# Patient Record
Sex: Male | Born: 1980 | Hispanic: No | Marital: Single | Smoking: Current every day smoker
Health system: Southern US, Community
[De-identification: ages and names within clinical notes are randomized; demographics above are authoritative.]

## PROBLEM LIST (undated history)

## (undated) DIAGNOSIS — M899 Disorder of bone, unspecified: Secondary | ICD-10-CM

## (undated) HISTORY — PX: APPENDECTOMY: SHX54

---

## 2011-05-30 ENCOUNTER — Encounter: Payer: Self-pay | Admitting: Internal Medicine

## 2011-05-30 DIAGNOSIS — Z0000 Encounter for general adult medical examination without abnormal findings: Secondary | ICD-10-CM | POA: Insufficient documentation

## 2011-06-03 ENCOUNTER — Ambulatory Visit: Payer: Self-pay | Admitting: Internal Medicine

## 2011-07-20 ENCOUNTER — Emergency Department (INDEPENDENT_AMBULATORY_CARE_PROVIDER_SITE_OTHER): Payer: Self-pay

## 2011-07-20 ENCOUNTER — Encounter (HOSPITAL_COMMUNITY): Payer: Self-pay | Admitting: Emergency Medicine

## 2011-07-20 ENCOUNTER — Emergency Department (INDEPENDENT_AMBULATORY_CARE_PROVIDER_SITE_OTHER)
Admission: EM | Admit: 2011-07-20 | Discharge: 2011-07-20 | Disposition: A | Discharge status: Home / Self Care | Payer: Self-pay | Source: Home / Self Care | Attending: Emergency Medicine | Admitting: Emergency Medicine

## 2011-07-20 DIAGNOSIS — S39012A Strain of muscle, fascia and tendon of lower back, initial encounter: Secondary | ICD-10-CM

## 2011-07-20 DIAGNOSIS — S339XXA Sprain of unspecified parts of lumbar spine and pelvis, initial encounter: Secondary | ICD-10-CM

## 2011-07-20 HISTORY — DX: Disorder of bone, unspecified: M89.9

## 2011-07-20 LAB — POCT URINALYSIS DIP (DEVICE)
Bilirubin Urine: NEGATIVE
Glucose, UA: NEGATIVE mg/dL
Hgb urine dipstick: NEGATIVE
Specific Gravity, Urine: 1.015 (ref 1.005–1.030)
Urobilinogen, UA: 0.2 mg/dL (ref 0.0–1.0)
pH: 7.5 (ref 5.0–8.0)

## 2011-07-20 MED ORDER — CYCLOBENZAPRINE HCL 10 MG PO TABS: 10.0000 mg | ORAL_TABLET | Freq: Three times a day (TID) | ORAL | Status: AC | PRN

## 2011-07-20 MED ORDER — HYDROCODONE-ACETAMINOPHEN 5-325 MG PO TABS: 2.0000 | ORAL_TABLET | ORAL | Status: AC | PRN

## 2011-07-20 MED ORDER — PREDNISONE 20 MG PO TABS: ORAL_TABLET | ORAL | Status: AC

## 2011-07-20 MED ORDER — MELOXICAM 15 MG PO TABS: 15.0000 mg | ORAL_TABLET | Freq: Every day | ORAL | Status: DC
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
## 2011-07-20 NOTE — ED Notes (Signed)
PT HERE WITH PROGRESSIVE LOWER BACK PAIN WITH RADIATING TO TAILBONE X 1 WEEK.PT STATES LAST WEEK HE HAD BILAT LEG INTERMITT NUMBNESS THEN SX WORSENED TO BACK AREA.HX BONE PROBLEMS FROM A CHILD BUT LAST INJECTION WAS AGE 31.DENIES INJURY OR STRAIN.SX CONSTANT ACHY THROB PAIN AND DIFF TO AMBULATE OR LIE DOWN

## 2011-07-20 NOTE — Discharge Instructions (Signed)
Take the medication as written. Take 1 gram of tylenol with the motrin up to 4 times a day as needed for pain and fever. This is an effective combination for pain. Take the hydrocodone/norco only for severe pain. Do not take the tylenol and hydrocodone/norco as they both have tylenol in them and too much can hurt your liver. Do the back exercises 5 repetitions each. Hold each exercise 5-10 seconds. Return if you get worse, have a  fever >100.4, or for any concerns.   Go to www.goodrx.com to look up your medications. This will give you a list of where you can find your prescriptions at the most affordable prices.

## 2011-11-11 ENCOUNTER — Emergency Department (INDEPENDENT_AMBULATORY_CARE_PROVIDER_SITE_OTHER): Payer: Self-pay

## 2011-11-11 ENCOUNTER — Encounter (HOSPITAL_COMMUNITY): Payer: Self-pay

## 2011-11-11 ENCOUNTER — Emergency Department (INDEPENDENT_AMBULATORY_CARE_PROVIDER_SITE_OTHER)
Admission: EM | Admit: 2011-11-11 | Discharge: 2011-11-11 | Disposition: A | Discharge status: Home / Self Care | Payer: Self-pay | Source: Home / Self Care | Attending: Emergency Medicine | Admitting: Emergency Medicine

## 2011-11-11 DIAGNOSIS — M546 Pain in thoracic spine: Secondary | ICD-10-CM

## 2011-11-11 MED ORDER — TRAMADOL HCL 50 MG PO TABS: 50.0000 mg | ORAL_TABLET | Freq: Four times a day (QID) | ORAL | Status: AC | PRN

## 2011-11-11 NOTE — ED Notes (Signed)
C/o pain to mid and upper back since yesterday- states worse on rt  side.  Denies heavy lifting or strenuous activity.  States he had similar 3 months ago.
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
## 2012-05-27 ENCOUNTER — Encounter (HOSPITAL_COMMUNITY): Payer: Self-pay | Admitting: Emergency Medicine

## 2012-05-27 ENCOUNTER — Emergency Department (HOSPITAL_COMMUNITY)
Admission: EM | Admit: 2012-05-27 | Discharge: 2012-05-27 | Disposition: A | Discharge status: Home / Self Care | Payer: No Typology Code available for payment source | Attending: Emergency Medicine | Admitting: Emergency Medicine

## 2012-05-27 ENCOUNTER — Emergency Department (HOSPITAL_COMMUNITY): Payer: No Typology Code available for payment source

## 2012-05-27 DIAGNOSIS — S298XXA Other specified injuries of thorax, initial encounter: Secondary | ICD-10-CM | POA: Insufficient documentation

## 2012-05-27 DIAGNOSIS — S161XXA Strain of muscle, fascia and tendon at neck level, initial encounter: Secondary | ICD-10-CM

## 2012-05-27 DIAGNOSIS — Y92410 Unspecified street and highway as the place of occurrence of the external cause: Secondary | ICD-10-CM | POA: Insufficient documentation

## 2012-05-27 DIAGNOSIS — S39012A Strain of muscle, fascia and tendon of lower back, initial encounter: Secondary | ICD-10-CM

## 2012-05-27 DIAGNOSIS — S139XXA Sprain of joints and ligaments of unspecified parts of neck, initial encounter: Secondary | ICD-10-CM | POA: Insufficient documentation

## 2012-05-27 DIAGNOSIS — Y9389 Activity, other specified: Secondary | ICD-10-CM | POA: Insufficient documentation

## 2012-05-27 DIAGNOSIS — Z8739 Personal history of other diseases of the musculoskeletal system and connective tissue: Secondary | ICD-10-CM | POA: Insufficient documentation

## 2012-05-27 DIAGNOSIS — IMO0002 Reserved for concepts with insufficient information to code with codable children: Secondary | ICD-10-CM | POA: Insufficient documentation

## 2012-05-27 DIAGNOSIS — F17200 Nicotine dependence, unspecified, uncomplicated: Secondary | ICD-10-CM | POA: Insufficient documentation

## 2012-05-27 MED ORDER — NAPROXEN 500 MG PO TABS: 500.0000 mg | ORAL_TABLET | Freq: Two times a day (BID) | ORAL | Status: DC

## 2012-05-27 MED ORDER — HYDROCODONE-ACETAMINOPHEN 5-325 MG PO TABS: 2.0000 | ORAL_TABLET | ORAL | Status: DC | PRN

## 2012-05-27 MED ORDER — METHOCARBAMOL 500 MG PO TABS: 500.0000 mg | ORAL_TABLET | Freq: Two times a day (BID) | ORAL | Status: DC
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
## 2012-06-12 NOTE — ED Provider Notes (Signed)
Medical screening examination/treatment/procedure(s) were performed by non-physician practitioner and as supervising physician I was immediately available for consultation/collaboration.  Addison Whidbee, MD 06/12/12 0710 
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
## 2018-12-07 ENCOUNTER — Other Ambulatory Visit: Payer: Self-pay

## 2018-12-07 ENCOUNTER — Ambulatory Visit (HOSPITAL_COMMUNITY)
Admission: EM | Admit: 2018-12-07 | Discharge: 2018-12-07 | Disposition: A | Discharge status: Home / Self Care | Payer: Self-pay | Attending: Family Medicine | Admitting: Family Medicine

## 2018-12-07 ENCOUNTER — Encounter (HOSPITAL_COMMUNITY): Payer: Self-pay

## 2018-12-07 DIAGNOSIS — M79605 Pain in left leg: Secondary | ICD-10-CM

## 2018-12-07 DIAGNOSIS — L309 Dermatitis, unspecified: Secondary | ICD-10-CM

## 2018-12-07 MED ORDER — TRIAMCINOLONE ACETONIDE 0.5 % EX OINT: 1.0000 "application " | TOPICAL_OINTMENT | Freq: Two times a day (BID) | CUTANEOUS | 0 refills | Status: DC

## 2018-12-07 MED ORDER — PREDNISONE 20 MG PO TABS: 40.0000 mg | ORAL_TABLET | Freq: Every day | ORAL | 0 refills | Status: AC

## 2018-12-07 NOTE — Discharge Instructions (Signed)
Continue with aquaphor, liberally apply especially after bathing and before bed.  Steroid ointment twice a day, very light amount to neck area and use for only 2 weeks at a time.  I have sent for 5 days of oral steroids as well which I hope will help with your leg as well as your skin.  If any worsening of your leg symptoms- redness, swelling, fevers, weakness, increased pain, unable to walk- or otherwise worsening please return or go to the ER.  Please establish with a primary care provider as needed for management as needed.

## 2018-12-07 NOTE — ED Triage Notes (Signed)
Patient presents to Urgent Care with complaints of itchy rash since cutting the grass a few days ago. Patient reports he thinks he has eczema, states his mother and daughter both have it.
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
## 2021-02-07 ENCOUNTER — Ambulatory Visit (HOSPITAL_COMMUNITY)
Admission: EM | Admit: 2021-02-07 | Discharge: 2021-02-07 | Disposition: A | Discharge status: Home / Self Care | Payer: Self-pay | Attending: Internal Medicine | Admitting: Internal Medicine

## 2021-02-07 ENCOUNTER — Encounter (HOSPITAL_COMMUNITY): Payer: Self-pay

## 2021-02-07 ENCOUNTER — Ambulatory Visit (HOSPITAL_COMMUNITY): Payer: Self-pay

## 2021-02-07 ENCOUNTER — Other Ambulatory Visit: Payer: Self-pay

## 2021-02-07 ENCOUNTER — Ambulatory Visit (INDEPENDENT_AMBULATORY_CARE_PROVIDER_SITE_OTHER): Payer: Self-pay

## 2021-02-07 DIAGNOSIS — M79642 Pain in left hand: Secondary | ICD-10-CM

## 2021-02-07 DIAGNOSIS — S6722XA Crushing injury of left hand, initial encounter: Secondary | ICD-10-CM

## 2021-02-07 MED ORDER — TRAMADOL HCL 50 MG PO TABS: 50.0000 mg | ORAL_TABLET | Freq: Four times a day (QID) | ORAL | 0 refills | Status: DC | PRN

## 2021-02-07 NOTE — Discharge Instructions (Addendum)
-  Tramadol for pain, up to every 6 hours. This medication can cause drowsiness, so don't take before driving. Don't drink alcohol while on this medication. -You can take ibuprofen and tylenol for additional relief:  -You can take Tylenol up to 1000 mg 3 times daily, and ibuprofen up to 600 mg 3 times daily with food.  You can take these together, or alternate every 3-4 hours. -Ice, rest -Use the wrist brace while pain persists -If symptoms persist in 3 days, follow-up with an orthopedist. I recommend [facility] at [address], [place], [place] [address]. You can schedule an appointment by calling [phone]) or online ([email or web address]), but they also have a walk-in clinic M-F 8a-8p and Sat 10a-3p.
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
## 2021-02-07 NOTE — ED Triage Notes (Signed)
Pt presents with a L hand injury today around 0730. Pt states the hand got caught in between a [place] and trailer.

## 2022-05-22 IMAGING — DX DG HAND COMPLETE 3+V*L*
3 series · 3 of 3 positions shown · non-contrast
Comparison: None.

CLINICAL DATA: Blunt trauma to the hand with pain, initial
encounter

EXAM:
LEFT HAND - COMPLETE 3+ VIEW

[hand pa]
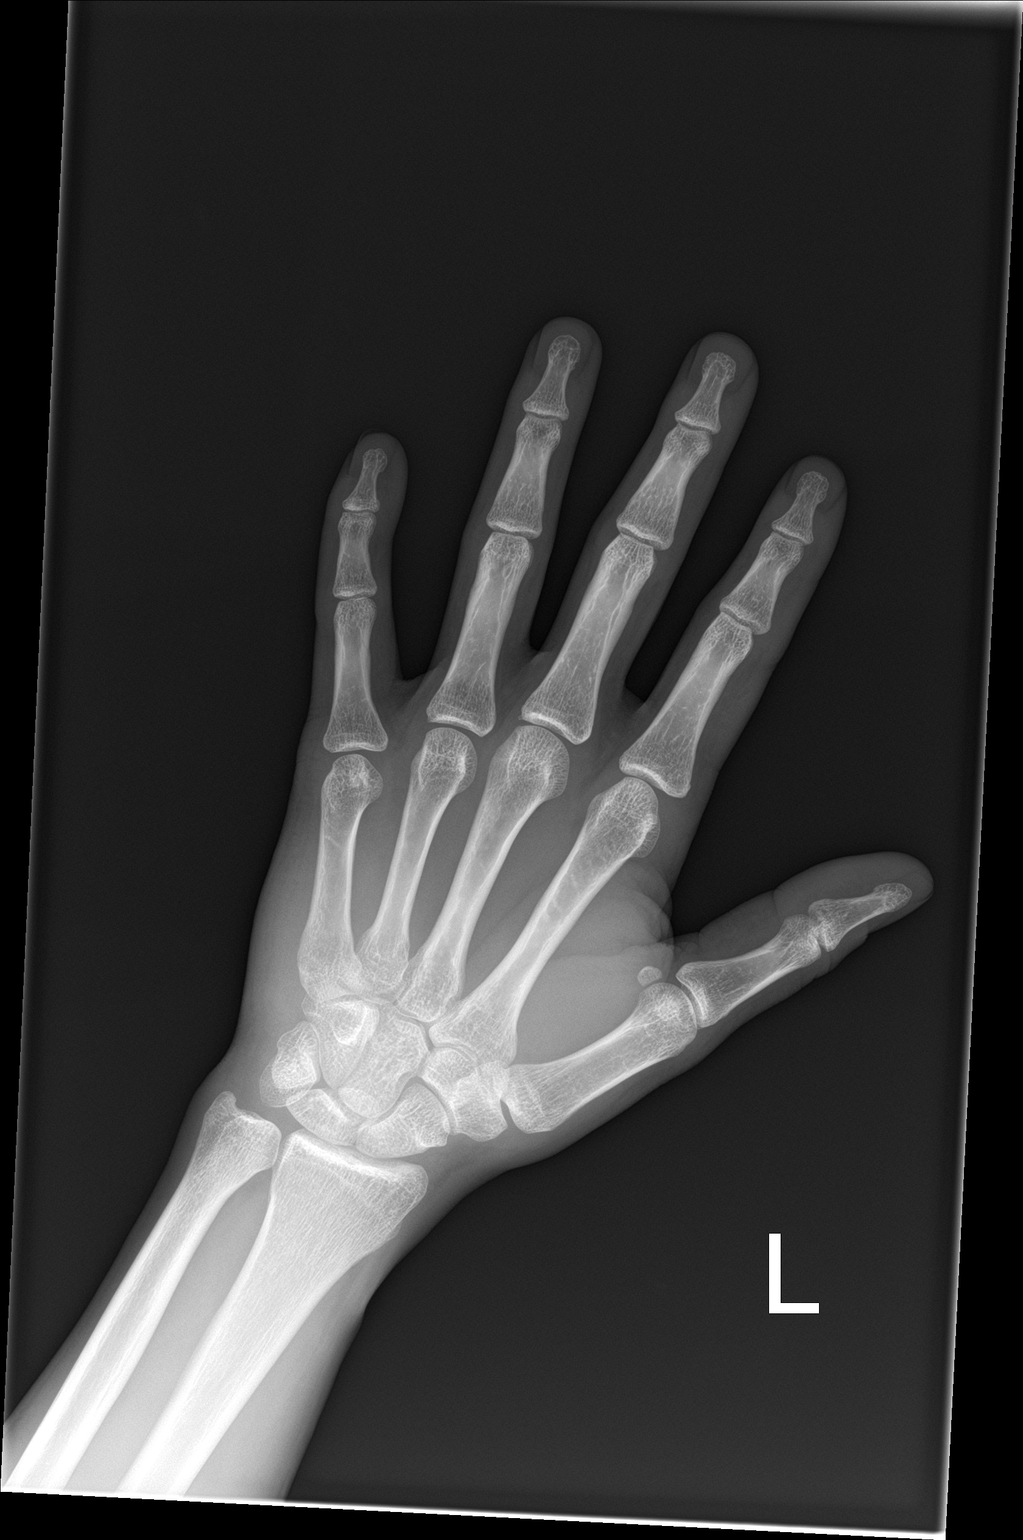

[hand obl]
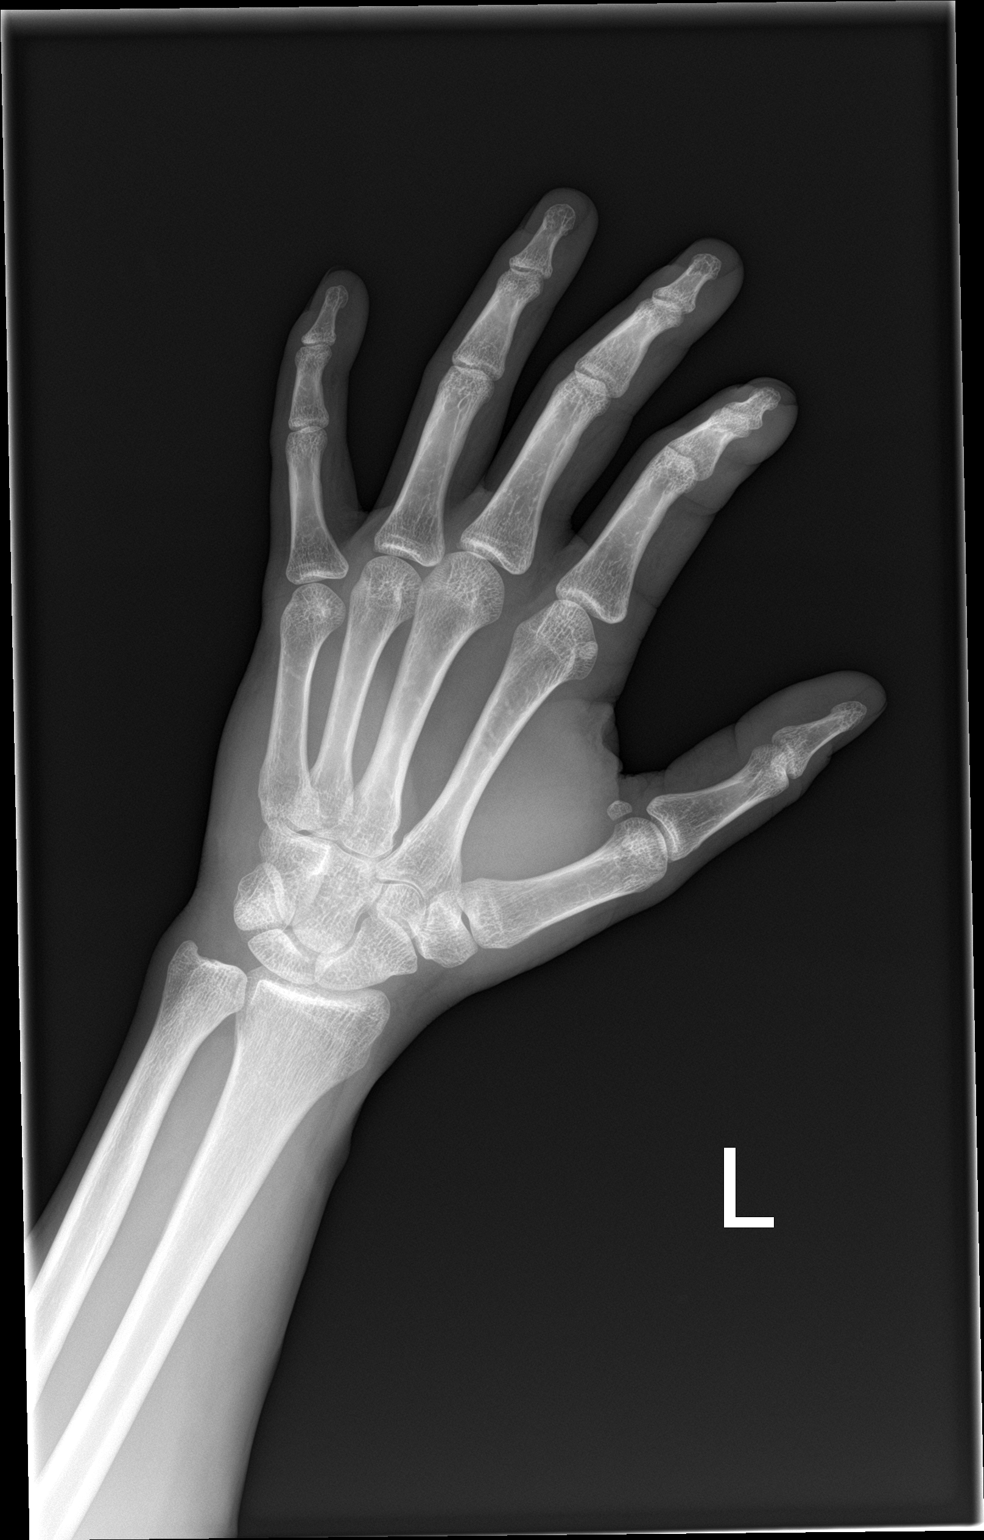

[hand lat]
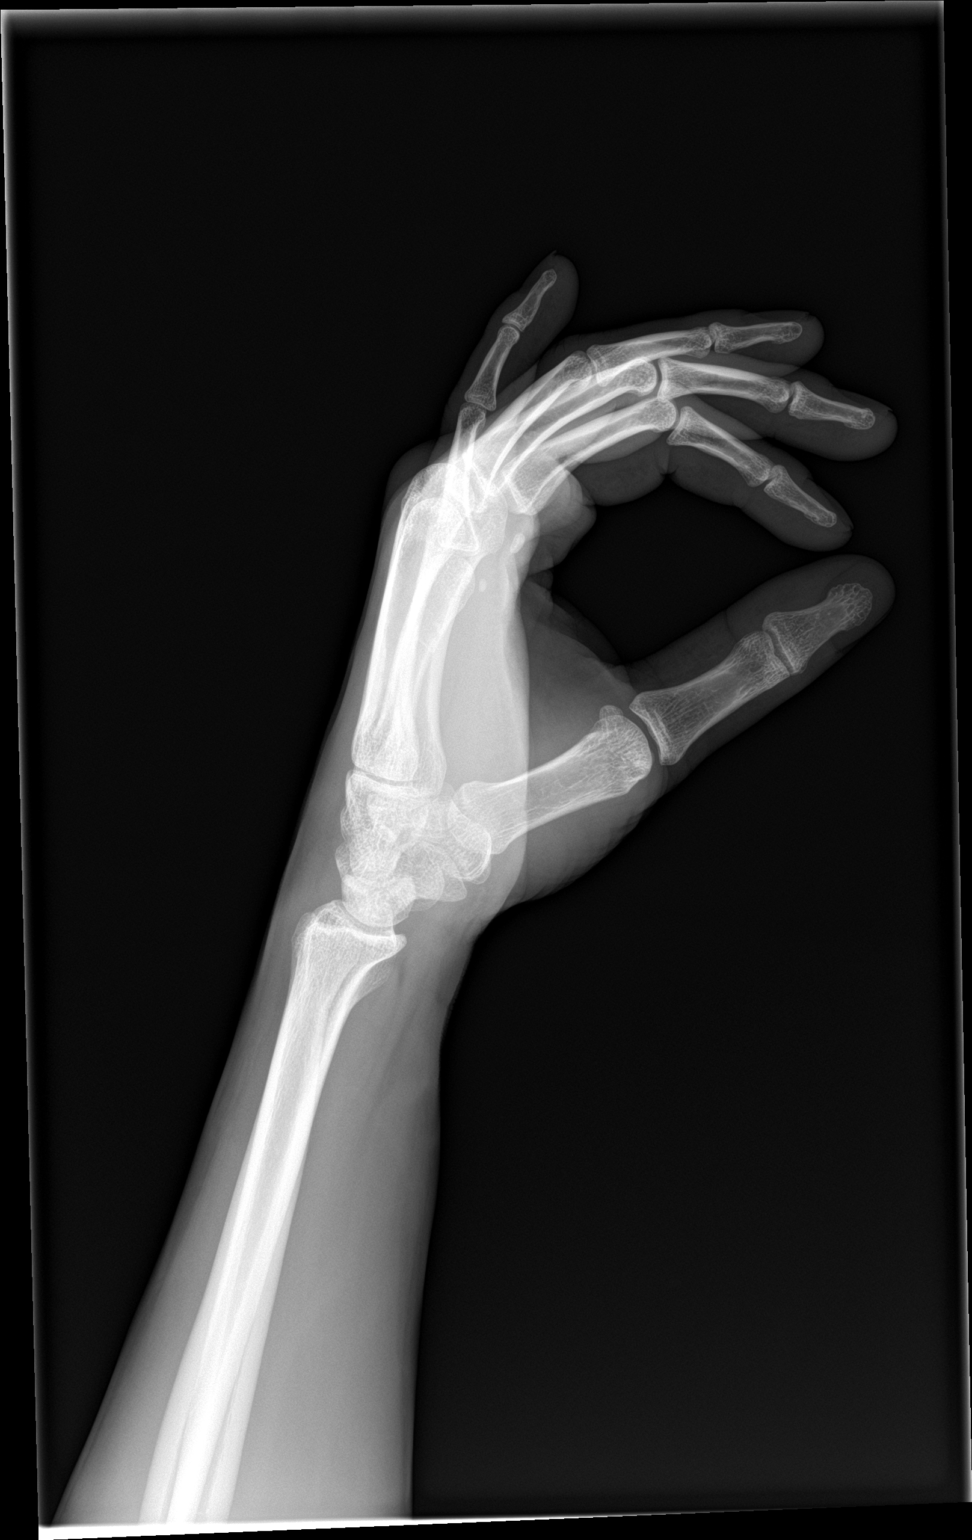

[3 of 3 positions shown; findings below may reference images not displayed]

FINDINGS: There is no evidence of fracture or dislocation. There is no
evidence of arthropathy or other focal bone abnormality. Soft
tissues are unremarkable.
IMPRESSION: No acute abnormality noted.

## 2023-07-19 ENCOUNTER — Ambulatory Visit (HOSPITAL_COMMUNITY)
Admission: EM | Admit: 2023-07-19 | Discharge: 2023-07-19 | Disposition: A | Discharge status: Home / Self Care | Payer: Self-pay | Attending: Internal Medicine | Admitting: Internal Medicine

## 2023-07-19 ENCOUNTER — Other Ambulatory Visit: Payer: Self-pay

## 2023-07-19 ENCOUNTER — Encounter (HOSPITAL_COMMUNITY): Payer: Self-pay | Admitting: *Deleted

## 2023-07-19 ENCOUNTER — Ambulatory Visit (INDEPENDENT_AMBULATORY_CARE_PROVIDER_SITE_OTHER): Payer: Self-pay

## 2023-07-19 DIAGNOSIS — R200 Anesthesia of skin: Secondary | ICD-10-CM

## 2023-07-19 DIAGNOSIS — M542 Cervicalgia: Secondary | ICD-10-CM

## 2023-07-19 MED ORDER — PREDNISONE 20 MG PO TABS: 40.0000 mg | ORAL_TABLET | Freq: Every day | ORAL | 0 refills | Status: AC

## 2023-07-19 MED ORDER — CYCLOBENZAPRINE HCL 5 MG PO TABS: 5.0000 mg | ORAL_TABLET | Freq: Three times a day (TID) | ORAL | 0 refills | Status: DC | PRN
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
## 2023-07-19 NOTE — Discharge Instructions (Addendum)
 X-ray of the neck done today.  Final evaluation by the radiologist is still pending. After the radiologist has read the x-ray, if anything changes from our evaluation then we will contact you. This is likely a nerve impingement and may need to see a specialist in the neck if symptoms do not improve or recur.  We will treat with muscle relaxers and steroids as followed: Prednisone 40 mg (2 tablets) once daily for 5 days. Take this in the morning.  This is a steroid to help with inflammation and pain. Flexeril 5 mg every 8 hours as needed for muscle spasms.  Use caution as this medication can cause drowsiness. If your symptoms fail to improve or if the symptoms improve and then recur, then you will likely need a referral to a neck/back specialist.  This will need to be done through a primary care physician. May return to urgent care as needed

## 2023-07-19 NOTE — ED Triage Notes (Signed)
 PT reports for 2 weeks he has had numbness to RT hand and now his whole RT arm is numb up to Rt shoulder.  Today he now has also started having numbness to Lt index finer. Pt is a [employer].

## 2023-07-21 ENCOUNTER — Encounter (HOSPITAL_COMMUNITY): Payer: Self-pay

## 2023-07-21 ENCOUNTER — Ambulatory Visit (HOSPITAL_COMMUNITY)
Admission: EM | Admit: 2023-07-21 | Discharge: 2023-07-21 | Disposition: A | Discharge status: Home / Self Care | Payer: Self-pay | Attending: Emergency Medicine | Admitting: Emergency Medicine

## 2023-07-21 DIAGNOSIS — M545 Low back pain, unspecified: Secondary | ICD-10-CM

## 2023-07-21 MED ORDER — KETOROLAC TROMETHAMINE 60 MG/2ML IM SOLN
30.0000 mg | Freq: Once | INTRAMUSCULAR | Status: AC
  Administered 2023-07-21: 30 mg via INTRAMUSCULAR

## 2023-07-21 MED ORDER — DEXAMETHASONE SODIUM PHOSPHATE 10 MG/ML IJ SOLN
10.0000 mg | Freq: Once | INTRAMUSCULAR | Status: AC
  Administered 2023-07-21: 10 mg via INTRAMUSCULAR

## 2023-07-21 MED ORDER — DEXAMETHASONE SODIUM PHOSPHATE 10 MG/ML IJ SOLN
INTRAMUSCULAR | Status: AC
  Filled 2023-07-21: qty 1

## 2023-07-21 MED ORDER — KETOROLAC TROMETHAMINE 30 MG/ML IJ SOLN
INTRAMUSCULAR | Status: AC
  Filled 2023-07-21: qty 1
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
## 2023-07-21 NOTE — Discharge Instructions (Signed)
 You appear to be having muscular right-sided low back pain.  The steroid injection with the Decadron and the Toradol injection should help.  You can do warm compresses, gentle stretching and continue with your muscle relaxers.  The muscle relaxers do cause drowsiness, I suggest taking this prior to bed to help. Continue the steroids daily with breakfast.  Seek immediate care for any leg numbness, trouble walking, incontinence, or if these medicines do not improve your symptoms.  It may be beneficial to follow-up with the orthopedic specialist, I have attached information for you to follow-up with.

## 2023-07-21 NOTE — ED Triage Notes (Signed)
 Patient states he was seen here Monday for numbness to the right arm. States he has been taking the prescribed medication, but today after taking the medication started having severe right sided back pain and vomited.

## 2023-08-25 ENCOUNTER — Encounter: Payer: Self-pay | Admitting: Internal Medicine

## 2023-08-25 ENCOUNTER — Ambulatory Visit (INDEPENDENT_AMBULATORY_CARE_PROVIDER_SITE_OTHER): Payer: Self-pay | Admitting: Internal Medicine

## 2023-08-25 VITALS — BP 128/80 | HR 76 | Temp 97.7°F | Ht 69.0 in | Wt 150.4 lb

## 2023-08-25 DIAGNOSIS — M5412 Radiculopathy, cervical region: Secondary | ICD-10-CM
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
## 2023-09-20 ENCOUNTER — Ambulatory Visit: Admitting: Physical Medicine and Rehabilitation

## 2023-09-20 ENCOUNTER — Encounter: Payer: Self-pay | Admitting: Physical Medicine and Rehabilitation

## 2023-09-20 DIAGNOSIS — M25511 Pain in right shoulder: Secondary | ICD-10-CM

## 2023-09-20 DIAGNOSIS — M7918 Myalgia, other site: Secondary | ICD-10-CM

## 2023-09-20 DIAGNOSIS — M898X1 Other specified disorders of bone, shoulder: Secondary | ICD-10-CM

## 2023-09-20 DIAGNOSIS — G8929 Other chronic pain: Secondary | ICD-10-CM | POA: Diagnosis not present
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
## 2023-09-20 NOTE — Progress Notes (Signed)
 Pain Scale   Average Pain 6 Patient advising he has neck pain radiating to his right shoulder, and causing him to have numbness ant tingling in his right arm.        +Driver, -BT, -Dye Allergies.

## 2023-10-15 ENCOUNTER — Ambulatory Visit: Admitting: Physical Therapy

## 2023-11-05 ENCOUNTER — Other Ambulatory Visit: Payer: Self-pay

## 2023-11-05 ENCOUNTER — Ambulatory Visit

## 2023-11-05 DIAGNOSIS — M25511 Pain in right shoulder: Secondary | ICD-10-CM | POA: Diagnosis not present

## 2023-11-05 DIAGNOSIS — M5412 Radiculopathy, cervical region: Secondary | ICD-10-CM | POA: Diagnosis not present
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
## 2023-11-15 ENCOUNTER — Ambulatory Visit: Admitting: Physical Medicine and Rehabilitation
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
## 2023-12-03 ENCOUNTER — Encounter

## 2023-12-03 ENCOUNTER — Telehealth: Payer: Self-pay

## 2023-12-03 NOTE — Telephone Encounter (Signed)
 LMOVM about missed appt 12/03/23 at 9:30. Confirmed next appointment is 12/10/23.
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
## 2023-12-10 ENCOUNTER — Encounter

## 2023-12-17 ENCOUNTER — Encounter
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
## 2023-12-27 ENCOUNTER — Ambulatory Visit

## 2023-12-27 DIAGNOSIS — M25511 Pain in right shoulder: Secondary | ICD-10-CM

## 2023-12-27 DIAGNOSIS — M5412 Radiculopathy, cervical region: Secondary | ICD-10-CM | POA: Diagnosis not present

## 2024-02-07 ENCOUNTER — Encounter: Payer: Self-pay | Admitting: Radiology
